# Patient Record
Sex: Male | Born: 2000 | Race: Black or African American | Hispanic: No | Marital: Single | Smoking: Never smoker
Health system: Southern US, Community
[De-identification: ages and names within clinical notes are randomized; demographics above are authoritative.]

## PROBLEM LIST (undated history)

## (undated) DIAGNOSIS — I456 Pre-excitation syndrome: Secondary | ICD-10-CM

## (undated) DIAGNOSIS — B360 Pityriasis versicolor: Secondary | ICD-10-CM

## (undated) DIAGNOSIS — R011 Cardiac murmur, unspecified: Secondary | ICD-10-CM

## (undated) DIAGNOSIS — J45909 Unspecified asthma, uncomplicated: Secondary | ICD-10-CM

## (undated) HISTORY — DX: Unspecified asthma, uncomplicated: J45.909

## (undated) HISTORY — DX: Pityriasis versicolor: B36.0

## (undated) HISTORY — PX: CIRCUMCISION: SUR203

---
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
## 2018-03-08 ENCOUNTER — Ambulatory Visit: Payer: Managed Care, Other (non HMO)

## 2018-04-05 ENCOUNTER — Ambulatory Visit (INDEPENDENT_AMBULATORY_CARE_PROVIDER_SITE_OTHER): Payer: Managed Care, Other (non HMO) | Admitting: Pediatrics

## 2018-04-05 ENCOUNTER — Encounter: Payer: Self-pay | Admitting: Pediatrics

## 2018-04-05 VITALS — Temp 98.4°F | Wt 314.6 lb

## 2018-04-05 DIAGNOSIS — G8929 Other chronic pain: Secondary | ICD-10-CM | POA: Diagnosis not present

## 2018-04-05 DIAGNOSIS — Z113 Encounter for screening for infections with a predominantly sexual mode of transmission: Secondary | ICD-10-CM

## 2018-04-05 DIAGNOSIS — M545 Low back pain: Secondary | ICD-10-CM

## 2018-04-05 NOTE — Progress Notes (Signed)
  History was provided by the patient.  No interpreter necessary.  Alexander Wright is a 17 y.o. male presents for  Chief Complaint  Patient presents with  . Back Pain    lower back pain for about 1 year- not as bad during football season but noticed it worsened after it ended last year around November- has not beem very active due to the pain.  Has been taking ibuprofen to aleviate symptoms   Takes motrin three times a week but back pain doesn't completely go away.  Takes 800mg .  Pain is worse when he is walking or running.  When he he lays flat the back pain is better.  No bowel or voiding problems.  Back pain doesn't radiate it stays in the lower area over the spine.  More sharp if he twists his body, squeezy pain normally.  Pain is keeping him from being active now.     The following portions of the patient's history were reviewed and updated as appropriate: allergies, current medications, past family history, past medical history, past social history, past surgical history and problem list.  Review of Systems  Constitutional: Negative for fever.  Gastrointestinal: Negative for abdominal pain and constipation.  Genitourinary: Negative for dysuria and urgency.  Musculoskeletal: Positive for back pain. Negative for falls, joint pain, myalgias and neck pain.     Physical Exam:  Temp 98.4 F (36.9 C) (Oral)   Wt (!) 314 lb 9.6 oz (142.7 kg)  Blood pressure percentiles are not available for patients who are 18 years or older. Wt Readings from Last 3 Encounters:  04/05/18 (!) 314 lb 9.6 oz (142.7 kg) (>99 %, Z= 3.18)*  11/30/17 288 lb (130.6 kg) (>99 %, Z= 2.92)*  10/06/17 (!) 300 lb 6.4 oz (136.3 kg) (>99 %, Z= 3.05)*   * Growth percentiles are based on CDC (Boys, 2-20 Years) data.    General:   alert, cooperative, appears stated age and no distress  Heart:   regular rate and rhythm, S1, S2 normal, no murmur, click, rub or gallop   back Tenderness over the lower lumbar spine, pain when  he bends forward and back.  Pain with hip flexion and extension.  No muscle tightness appreciated.  No back assymmetry on bend test   Neuro:  normal without focal findings     Assessment/Plan: 1. Routine screening for STI (sexually transmitted infection)  - C. trachomatis/N. gonorrhoeae RNA  2. Chronic midline low back pain without sciatica Patient has already been to a chiropractor and had a few sessions without improvement. He has been on pain medications for 1 year.  He has done conservative treatment without relief and needs better imaging of the spine to ensure there is no spondylopathy.  Waiting on PA   - MR Lumbar Spine Wo Contrast; Future - Ambulatory referral to Sports Medicine    Cherece Griffith CitronNicole Grier, MD  04/05/18

## 2018-04-06 LAB — C. TRACHOMATIS/N. GONORRHOEAE RNA
C. trachomatis RNA, TMA: NOT DETECTED
N. gonorrhoeae RNA, TMA: NOT DETECTED

## 2018-04-07 ENCOUNTER — Encounter: Payer: Self-pay | Admitting: Pediatrics

## 2018-04-07 ENCOUNTER — Telehealth: Payer: Self-pay

## 2018-04-07 ENCOUNTER — Ambulatory Visit (INDEPENDENT_AMBULATORY_CARE_PROVIDER_SITE_OTHER): Payer: Managed Care, Other (non HMO) | Admitting: Pediatrics

## 2018-04-07 ENCOUNTER — Encounter: Payer: Managed Care, Other (non HMO) | Admitting: Licensed Clinical Social Worker

## 2018-04-07 ENCOUNTER — Other Ambulatory Visit: Payer: Self-pay

## 2018-04-07 VITALS — BP 129/67 | Ht 67.0 in | Wt 313.2 lb

## 2018-04-07 DIAGNOSIS — Q210 Ventricular septal defect: Secondary | ICD-10-CM

## 2018-04-07 DIAGNOSIS — Z113 Encounter for screening for infections with a predominantly sexual mode of transmission: Secondary | ICD-10-CM

## 2018-04-07 DIAGNOSIS — Z020 Encounter for examination for admission to educational institution: Secondary | ICD-10-CM | POA: Diagnosis not present

## 2018-04-07 DIAGNOSIS — Z68.41 Body mass index (BMI) pediatric, greater than or equal to 95th percentile for age: Secondary | ICD-10-CM

## 2018-04-07 DIAGNOSIS — Z0001 Encounter for general adult medical examination with abnormal findings: Secondary | ICD-10-CM

## 2018-04-07 DIAGNOSIS — Z111 Encounter for screening for respiratory tuberculosis: Secondary | ICD-10-CM

## 2018-04-07 LAB — POCT URINALYSIS DIPSTICK
Bilirubin, UA: NEGATIVE
Blood, UA: NEGATIVE
Glucose, UA: NEGATIVE
Ketones, UA: NEGATIVE
LEUKOCYTES UA: NEGATIVE
Nitrite, UA: NEGATIVE
PH UA: 5 (ref 5.0–8.0)
PROTEIN UA: POSITIVE — AB
Spec Grav, UA: 1.025 (ref 1.010–1.025)
UROBILINOGEN UA: 0.2 U/dL

## 2018-04-07 LAB — POCT HEMOGLOBIN: Hemoglobin: 15.6 g/dL (ref 14.1–18.1)

## 2018-04-07 LAB — POCT GLYCOSYLATED HEMOGLOBIN (HGB A1C): HEMOGLOBIN A1C: 5.6 % (ref 4.0–5.6)

## 2018-04-07 LAB — POCT RAPID HIV: Rapid HIV, POC: NEGATIVE

## 2018-04-07 NOTE — Progress Notes (Signed)
Adolescent Well Care Visit Alexander Wright is a 17 y.o. male who is here for well care.    PCP:  Gwenith DailyGrier, Cherece Nicole, MD   History was provided by the patient.  Confidentiality was discussed with the patient and, if applicable, with caregiver as well. Patient's personal or confidential phone number: [phone]   Current Issues: Current concerns include   School forms Going to [employer]  VSD No symptoms Last saw cards two years ago  Nutrition: Nutrition/Eating Behaviors: feels like eats a balanced diet. Eats vegetables   Exercise/ Media: Play any Sports?/ Exercise: not still playing sports because of back pain Hoping to potentially play in college- hoping to do track. Does shotput, discus and hammer throw  Sleep:  Sleep: good  Social Screening: Lives with:  Parents- going to be on campus in college Parental relations:  good   Education: Going to [employer]   Confidential Social History: Tobacco?  no Secondhand smoke exposure?  no Drugs/ETOH?  no  Sexually Active?  yes  With women Pregnancy Prevention: condoms  Safe at home, in school & in relationships?  Yes Safe to self?  Yes   Screenings:   PHQ-9 completed and results indicated score 1, low risk, no SI  Physical Exam:  Vitals:   04/07/18 1514  BP: 129/67  Weight: (!) 313 lb 4 oz (142.1 kg)  Height: 5\' 7"  (1.702 m)   BP 129/67 (BP Location: Right Arm, Patient Position: Sitting, Cuff Size: Large)   Ht 5\' 7"  (1.702 m)   Wt (!) 313 lb 4 oz (142.1 kg)   BMI 49.06 kg/m  Body mass index: body mass index is 49.06 kg/m. Blood pressure percentiles are not available for patients who are 18 years or older.   Hearing Screening   Method: Audiometry   125Hz  250Hz  500Hz  1000Hz  2000Hz  3000Hz  4000Hz  6000Hz  8000Hz   Right ear:   20 20  20 20     Left ear:   20 20  20 20       Visual Acuity Screening   Right eye Left eye Both eyes  Without correction: 10/10 10/10 10/10   With correction:       General  Appearance:   alert, oriented, no acute distress and obese  HENT: Normocephalic, no obvious abnormality, conjunctiva clear  Mouth:   Normal appearing teeth, no obvious discoloration, dental caries, or dental caps  Neck:   Supple; thyroid: no enlargement, symmetric, no tenderness/mass/nodules  Lungs:   Clear to auscultation bilaterally, normal work of breathing  Heart:   Regular rate and rhythm, S1 and S2 normal, 2/6 systolic blowing murmurs;   Abdomen:   Soft, non-tender, no mass, or organomegaly  GU normal male genitals, no testicular masses or hernia  Musculoskeletal:   Tone and strength strong and symmetrical, all extremities               Lymphatic:   No cervical adenopathy  Skin/Hair/Nails:   Skin warm, dry and intact, no rashes, no bruises or petechiae. Acanthosis nigricans  Neurologic:   Strength, gait, and coordination normal and age-appropriate     Assessment and Plan:   1. Encounter for general adult medical examination with abnormal findings   2. BMI, pediatric > 99% for age Discussed healthy diet and exercise Not exercising due to back pain See prior visit addressing back pain Can swim without pain- will start doing that  3. Severe obesity (BMI >= 40) (HCC) A1c is 5.6- still in normal range. At risk due to family history  of diabetes and also has obesity and acanthosis. Recommend continue monitoring periodically and work on diet/exercise - POCT glycosylated hemoglobin (Hb A1C)  4. Screening examination for venereal disease - POCT Rapid HIV- negative  5. Muscular ventricular septal defect (VSD) History of small, hemodynamically insignificant VSD. Followed by [facility] peds cards- recommended calling to make an appointment because due to see them. Put in referral for adult cardiology to start transition - Ambulatory referral to Cardiology  6. Encounter for school health examination - POCT hemoglobin - POCT urinalysis dipstick - TB Skin Test  7. Screening for  tuberculosis - QuantiFERON-TB Gold Plus   BMI is not appropriate for age  Hearing screening result:normal Vision screening result: normal   Return if symptoms worsen or fail to improve..  Taffie Eckmann [place], MD

## 2018-04-07 NOTE — Patient Instructions (Signed)
  General Health / Clinics (Adults) Orange Card (for Adults) through [facility]: [phone]  [facility]:   [phone]  [facility]:   [phone]  Health Department:  [phone]  [facility]:  [phone] / [phone]  [facility]:   [phone]  [facility]:   [phone]     Websites for Teens  General www.youngwomenshealth.org www.youngmenshealthsite.org www.teenhealthfx.com www.teenhealth.org  Sexual and Reproductive Health www.bedsider.org www.seventeendays.org www.plannedparenthood.org www.StrengthHappens.sisexetc.org     Look at zerotothree.org for lots of good ideas on how to help your baby develop.  The best website for information about children is CosmeticsCritic.siwww.healthychildren.org.  All the information is reliable and up-to-date.    At every age, encourage reading.  Reading with your child is one of the best activities you can do.   Use the [employer] library near your home and borrow books every week.  The [employer] library offers amazing FREE programs for children of all ages.  Just go to www.greensborolibrary.org   Call the main number [phone] before going to the Emergency Department unless it's a true emergency.  For a true emergency, go to the [facility].   When the clinic is closed, a nurse always answers the main number [phone] and a doctor is always available.    Clinic is open for sick visits only on Saturday mornings from 8:30AM to 12:30PM. Call first thing on Saturday morning for an appointment.

## 2018-04-07 NOTE — Telephone Encounter (Signed)
PA initiated for MRI of low back. [id number] service number.

## 2018-04-08 NOTE — Telephone Encounter (Signed)
PA pending. [facility] as [facility] was not in network.

## 2018-04-09 ENCOUNTER — Other Ambulatory Visit: Payer: Self-pay | Admitting: Pediatrics

## 2018-04-09 LAB — QUANTIFERON-TB GOLD PLUS
NIL: 0.02 [IU]/mL
QUANTIFERON-TB GOLD PLUS: NEGATIVE
TB1-NIL: 0.01 IU/mL
TB2-NIL: 0 [IU]/mL

## 2018-04-09 NOTE — Telephone Encounter (Signed)
Case has been approved PA# [id number]. Hard copy taken to referral office.

## 2018-04-09 NOTE — Addendum Note (Signed)
Addended by: Warden FillersGRIER, Abdalla Naramore on: 04/09/2018 08:51 AM   Modules accepted: Orders

## 2018-04-24 ENCOUNTER — Ambulatory Visit
Admission: RE | Admit: 2018-04-24 | Discharge: 2018-04-24 | Disposition: A | Discharge status: Home / Self Care | Payer: Self-pay | Source: Ambulatory Visit | Attending: Pediatrics | Admitting: Pediatrics

## 2018-04-24 DIAGNOSIS — M545 Low back pain, unspecified: Secondary | ICD-10-CM

## 2018-04-24 DIAGNOSIS — G8929 Other chronic pain: Secondary | ICD-10-CM

## 2018-04-26 NOTE — Progress Notes (Signed)
Notified of results and plan of care.

## 2018-04-26 NOTE — Addendum Note (Signed)
Addended by: Warden FillersGRIER, CHERECE on: 04/26/2018 02:32 PM   Modules accepted: Orders

## 2018-05-11 ENCOUNTER — Ambulatory Visit: Payer: Managed Care, Other (non HMO) | Attending: Pediatrics | Admitting: Physical Therapy

## 2018-05-13 ENCOUNTER — Ambulatory Visit: Payer: Self-pay | Admitting: Cardiovascular Disease

## 2018-05-21 ENCOUNTER — Encounter: Payer: Self-pay | Admitting: Licensed Clinical Social Worker

## 2018-05-21 ENCOUNTER — Ambulatory Visit: Payer: Managed Care, Other (non HMO) | Admitting: Pediatrics

## 2018-11-09 ENCOUNTER — Encounter: Payer: Self-pay | Admitting: Pediatrics

## 2018-11-09 ENCOUNTER — Other Ambulatory Visit: Payer: Self-pay

## 2018-11-09 ENCOUNTER — Ambulatory Visit (INDEPENDENT_AMBULATORY_CARE_PROVIDER_SITE_OTHER): Payer: Managed Care, Other (non HMO) | Admitting: Pediatrics

## 2018-11-09 VITALS — Temp 98.1°F | Wt 326.2 lb

## 2018-11-09 DIAGNOSIS — J029 Acute pharyngitis, unspecified: Secondary | ICD-10-CM

## 2018-11-09 DIAGNOSIS — B349 Viral infection, unspecified: Secondary | ICD-10-CM

## 2018-11-09 LAB — POCT RAPID STREP A (OFFICE): RAPID STREP A SCREEN: NEGATIVE

## 2018-11-09 NOTE — Progress Notes (Signed)
Subjective:    Alexander Wright is a 18 y.o. old male here with his self for Cough (since Friday night ); sneezing; and Sore Throat (since Friday night ) .    HPI Chief Complaint  Patient presents with  . Cough    since Friday night   . sneezing  . Sore Throat    since Friday night    17yo here for URI sx.  It started w/ ST 4d. Ago, that has been worsening.  He now has chest congestion, RN and cough.  Robitussin has not helped. He c/o chest pain w/ cough and c/o HA when he sneezes.   Review of Systems  Constitutional: Negative for appetite change and fever.  HENT: Positive for congestion, sneezing and sore throat.   Respiratory: Positive for cough.     History and Problem List: Alexander Wright has Muscular ventricular septal defect (VSD); Asthma, mild intermittent; Concussion without loss of consciousness; BMI, pediatric > 99% for age; Elevated BP without diagnosis of hypertension; and Severe obesity (BMI >= 40) (HCC) on their problem list.  Alexander Wright  has a past medical history of Asthma, Heart murmur, Tinea versicolor, and WPW (Wolff-Parkinson-White syndrome).  Immunizations needed: none     Objective:    Temp 98.1 F (36.7 C) (Oral)   Wt (!) 326 lb 3.2 oz (148 kg)   BMI 51.09 kg/m  Physical Exam Constitutional:      Appearance: He is well-developed.  HENT:     Right Ear: Tympanic membrane and external ear normal.     Left Ear: Tympanic membrane and external ear normal.     Nose: Congestion present.     Mouth/Throat:     Pharynx: Posterior oropharyngeal erythema (mild) present.     Comments: Hypertrophied tonsils Eyes:     Pupils: Pupils are equal, round, and reactive to light.  Neck:     Musculoskeletal: Normal range of motion.  Cardiovascular:     Rate and Rhythm: Normal rate and regular rhythm.     Pulses: Normal pulses.     Heart sounds: Normal heart sounds.  Pulmonary:     Effort: Pulmonary effort is normal.     Breath sounds: Normal breath sounds.  Abdominal:     General:  Bowel sounds are normal.     Palpations: Abdomen is soft.  Skin:    General: Skin is warm.     Capillary Refill: Capillary refill takes less than 2 seconds.  Neurological:     Mental Status: He is alert and oriented to person, place, and time.        Assessment and Plan:   Alexander Wright is a 18 y.o. old male with  1. Sore throat  - POCT rapid strep A  2. Viral illness -supportive care -use albuterol as needed for cough or chest tightness.  Continue claritin and flonase for congestion    No follow-ups on file.  Marjory Sneddon, MD

## 2018-11-09 NOTE — Patient Instructions (Signed)
Viral Illness, Adult °Viruses are tiny germs that can get into a person's body and cause illness. There are many different types of viruses, and they cause many types of illness. Viral illnesses can range from mild to severe. They can affect various parts of the body. °Common illnesses that are caused by a virus include colds and the flu. Viral illnesses also include serious conditions such as HIV/AIDS (human immunodeficiency virus/acquired immunodeficiency syndrome). A few viruses have been linked to certain cancers. °What are the causes? °Many types of viruses can cause illness. Viruses invade cells in your body, multiply, and cause the infected cells to malfunction or die. When the cell dies, it releases more of the virus. When this happens, you develop symptoms of the illness, and the virus continues to spread to other cells. If the virus takes over the function of the cell, it can cause the cell to divide and grow out of control, as is the case when a virus causes cancer. °Different viruses get into the body in different ways. You can get a virus by: °· Swallowing food or water that is contaminated with the virus. °· Breathing in droplets that have been coughed or sneezed into the air by an infected person. °· Touching a surface that has been contaminated with the virus and then touching your eyes, nose, or mouth. °· Being bitten by an insect or animal that carries the virus. °· Having sexual contact with a person who is infected with the virus. °· Being exposed to blood or fluids that contain the virus, either through an open cut or during a transfusion. °If a virus enters your body, your body's defense system (immune system) will try to fight the virus. You may be at higher risk for a viral illness if your immune system is weak. °What are the signs or symptoms? °Symptoms vary depending on the type of virus and the location of the cells that it invades. Common symptoms of the main types of viral illnesses  include: °Cold and flu viruses °· Fever. °· Headache. °· Sore throat. °· Muscle aches. °· Nasal congestion. °· Cough. °Digestive system (gastrointestinal) viruses °· Fever. °· Abdominal pain. °· Nausea. °· Diarrhea. °Liver viruses (hepatitis) °· Loss of appetite. °· Tiredness. °· Yellowing of the skin (jaundice). °Brain and spinal cord viruses °· Fever. °· Headache. °· Stiff neck. °· Nausea and vomiting. °· Confusion or sleepiness. °Skin viruses °· Warts. °· Itching. °· Rash. °Sexually transmitted viruses °· Discharge. °· Swelling. °· Redness. °· Rash. °How is this treated? °Viruses can be difficult to treat because they live within cells. Antibiotic medicines do not treat viruses because these drugs do not get inside cells. Treatment for a viral illness may include: °· Resting and drinking plenty of fluids. °· Medicines to relieve symptoms. These can include over-the-counter medicine for pain and fever, medicines for cough or congestion, and medicines to relieve diarrhea. °· Antiviral medicines. These drugs are available only for certain types of viruses. They may help reduce flu symptoms if taken early. There are also many antiviral medicines for hepatitis and HIV/AIDS. °Some viral illnesses can be prevented with vaccinations. A common example is the flu shot. °Follow these instructions at home: °Medicines ° °· Take over-the-counter and prescription medicines only as told by your health care provider. °· If you were prescribed an antiviral medicine, take it as told by your health care provider. Do not stop taking the medicine even if you start to feel better. °· Be aware of when   antibiotics are needed and when they are not needed. Antibiotics do not treat viruses. If your health care provider thinks that you may have a bacterial infection as well as a viral infection, you may get an antibiotic. °? Do not ask for an antibiotic prescription if you have been diagnosed with a viral illness. That will not make your  illness go away faster. °? Frequently taking antibiotics when they are not needed can lead to antibiotic resistance. When this develops, the medicine no longer works against the bacteria that it normally fights. °General instructions °· Drink enough fluids to keep your urine clear or pale yellow. °· Rest as much as possible. °· Return to your normal activities as told by your health care provider. Ask your health care provider what activities are safe for you. °· Keep all follow-up visits as told by your health care provider. This is important. °How is this prevented? °Take these actions to reduce your risk of viral infection: °· Eat a healthy diet and get enough rest. °· Wash your hands often with soap and water. This is especially important when you are in public places. If soap and water are not available, use hand sanitizer. °· Avoid close contact with friends and family who have a viral illness. °· If you travel to areas where viral gastrointestinal infection is common, avoid drinking water or eating raw food. °· Keep your immunizations up to date. Get a flu shot every year as told by your health care provider. °· Do not share toothbrushes, nail clippers, razors, or needles with other people. °· Always practice safe sex. ° °Contact a health care provider if: °· You have symptoms of a viral illness that do not go away. °· Your symptoms come back after going away. °· Your symptoms get worse. °Get help right away if: °· You have trouble breathing. °· You have a severe headache or a stiff neck. °· You have severe vomiting or abdominal pain. °This information is not intended to replace advice given to you by your health care provider. Make sure you discuss any questions you have with your health care provider. °Document Released: 12/28/2015 Document Revised: 01/30/2016 Document Reviewed: 12/28/2015 °Elsevier Interactive Patient Education © 2019 Elsevier Inc. ° °

## 2019-05-17 ENCOUNTER — Other Ambulatory Visit: Payer: Self-pay

## 2019-05-17 DIAGNOSIS — Z20822 Contact with and (suspected) exposure to covid-19: Secondary | ICD-10-CM

## 2019-05-19 LAB — NOVEL CORONAVIRUS, NAA: SARS-CoV-2, NAA: DETECTED — AB

## 2019-05-19 NOTE — Progress Notes (Signed)
Patient's symptoms are loss of sense of smell and decreased sense of taste. Also is having some chest tightness but is not short of breath.  Explained to patient that he can use albuterol for chest tightness. Discussed quarantine and reasons he would need to seek care. Understanding verbalized.

## 2019-07-22 ENCOUNTER — Other Ambulatory Visit: Payer: Self-pay

## 2019-07-22 ENCOUNTER — Ambulatory Visit (INDEPENDENT_AMBULATORY_CARE_PROVIDER_SITE_OTHER): Payer: Managed Care, Other (non HMO) | Admitting: Student

## 2019-07-22 ENCOUNTER — Encounter: Payer: Self-pay | Admitting: Student

## 2019-07-22 DIAGNOSIS — J301 Allergic rhinitis due to pollen: Secondary | ICD-10-CM

## 2019-07-22 DIAGNOSIS — J069 Acute upper respiratory infection, unspecified: Secondary | ICD-10-CM

## 2019-07-22 DIAGNOSIS — J4520 Mild intermittent asthma, uncomplicated: Secondary | ICD-10-CM

## 2019-07-22 DIAGNOSIS — Z20822 Contact with and (suspected) exposure to covid-19: Secondary | ICD-10-CM

## 2019-07-22 MED ORDER — AEROCHAMBER PLUS FLO-VU LARGE MISC: 1.0000 | Freq: Once | 0 refills | Status: AC

## 2019-07-22 MED ORDER — ALBUTEROL SULFATE HFA 108 (90 BASE) MCG/ACT IN AERS: 2.0000 | INHALATION_SPRAY | RESPIRATORY_TRACT | 2 refills | Status: AC | PRN

## 2019-07-22 MED ORDER — FLUTICASONE PROPIONATE 50 MCG/ACT NA SUSP: 1.0000 | Freq: Two times a day (BID) | NASAL | 12 refills | Status: DC

## 2019-07-22 NOTE — Progress Notes (Signed)
Virtual Visit via Video Note  I connected with Alexander Wright on 07/22/19 at 11:10 AM EST by a video enabled telemedicine application and verified that I am speaking with the correct person using two identifiers.   Location of patient: Home   I discussed the limitations of evaluation and management by telemedicine and the availability of in person appointments.  I discussed that the purpose of this telehealth visit is to provide medical care while limiting exposure to the novel coronavirus.  The patient expressed understanding and agreed to proceed.  Reason for visit:  Rhinorrhea, sore throat  History of Present Illness:  Increased sweating for approximately 3 days Rhinorrhea started yesterday, no nasal congestion; sneezing, cough Throat felt dry and sore No fever, vomiting, diarrhea No difficulty breathing   Observations/Objective:  Well-appearing Speaking full sentences, comfortable work of breathing  Assessment and Plan:  Alexander Wright is a 18 year old male with history of allergic rhinitis and mild intermittent asthma that was seen via video visit for sore throat and rhinorrhea over last several days. No fever, difficulty breathing, vomiting, diarrhea, or rash. Covid tested today 11/20. Symptoms may be consistent with viral URI (Covid cannot be excluded) versus allergic rhinitis. Low concern for asthma exacerbation as no wheezing, shortness of breath, or difficulty breathing.   1. Viral upper respiratory infection Discussed supportive care, return precautions Covid test pending, discussed CDC isolation guidelines Provided work note via Pharmacist, community   2. Allergic rhinitis due to pollen, unspecified seasonality Restart Flonase Continue Claritin  - fluticasone (FLONASE) 50 MCG/ACT nasal spray; Place 1 spray into both nostrils 2 (two) times daily.  Dispense: 16 g; Refill: 12  3. Mild intermittent asthma without complication Refilled albuterol, ordered spacer Discussed use and reasons to call  clinic/be seen in ED - albuterol (VENTOLIN HFA) 108 (90 Base) MCG/ACT inhaler; Inhale 2 puffs into the lungs every 4 (four) hours as needed for wheezing or shortness of breath (or cough).  Dispense: 18 g; Refill: 2 - Spacer/Aero-Holding Chambers (AEROCHAMBER PLUS FLO-VU LARGE) MISC; 1 each by Other route once for 1 dose.  Dispense: 1 each; Refill: 0   Follow Up Instructions: PRN if symptoms worsen   I discussed the assessment and treatment plan with the patient and/or parent/guardian. They were provided an opportunity to ask questions and all were answered. They agreed with the plan and demonstrated an understanding of the instructions.   They were advised to call back or seek an in-person evaluation in the emergency room if the symptoms worsen or if the condition fails to improve as anticipated.  I spent 15 minutes on this telehealth visit inclusive of face-to-face video and care coordination time I was located at the office during this encounter.  Dorna Leitz, MD

## 2019-07-24 LAB — NOVEL CORONAVIRUS, NAA: SARS-CoV-2, NAA: NOT DETECTED

## 2019-07-25 ENCOUNTER — Telehealth: Payer: Self-pay

## 2019-07-25 NOTE — Telephone Encounter (Signed)
Alexander Wright had an appointment 07/22/2019 for URI and allergy symptoms. He started feeling better today. Reports sore throat resolved and he has no fever. Continues with nasal and chest congestion. Patient is to return to work today. Discussed with Dr. Michel Santee and plan is for him to stay home a few more days to continue recovery. Will write return to note work for him to return 07/28/2019.

## 2019-08-03 ENCOUNTER — Other Ambulatory Visit: Payer: Self-pay

## 2019-08-03 DIAGNOSIS — Z20822 Contact with and (suspected) exposure to covid-19: Secondary | ICD-10-CM

## 2019-08-05 LAB — NOVEL CORONAVIRUS, NAA: SARS-CoV-2, NAA: NOT DETECTED

## 2019-12-10 ENCOUNTER — Ambulatory Visit: Payer: Self-pay | Attending: Internal Medicine

## 2019-12-10 DIAGNOSIS — Z23 Encounter for immunization: Secondary | ICD-10-CM

## 2019-12-10 NOTE — Progress Notes (Signed)
   Covid-19 Vaccination Clinic  Name:  Alexander Wright    MRN: [id number] DOB: 01-Mar-2001  12/10/2019  Alexander Wright was observed post Covid-19 immunization for 15 minutes without incident. He was provided with Vaccine Information Sheet and instruction to access the V-Safe system.   Alexander Wright was instructed to call 911 with any severe reactions post vaccine: Marland Kitchen Difficulty breathing  . Swelling of face and throat  . A fast heartbeat  . A bad rash all over body  . Dizziness and weakness   Immunizations Administered    Name Date Dose VIS Date Route   Pfizer COVID-19 Vaccine 12/10/2019  3:20 PM 0.3 mL 08/12/2019 Intramuscular   Manufacturer: [employer], [employer]   Lot: [id number]   NDC: [address]-0

## 2019-12-23 ENCOUNTER — Telehealth (INDEPENDENT_AMBULATORY_CARE_PROVIDER_SITE_OTHER): Payer: Managed Care, Other (non HMO) | Admitting: Student in an Organized Health Care Education/Training Program

## 2019-12-23 ENCOUNTER — Other Ambulatory Visit: Payer: Self-pay

## 2019-12-23 ENCOUNTER — Encounter: Payer: Self-pay | Admitting: Student in an Organized Health Care Education/Training Program

## 2019-12-23 DIAGNOSIS — B86 Scabies: Secondary | ICD-10-CM | POA: Diagnosis not present

## 2019-12-23 MED ORDER — PERMETHRIN 5 % EX CREA: 1.0000 "application " | TOPICAL_CREAM | Freq: Once | CUTANEOUS | 0 refills | Status: AC

## 2019-12-23 NOTE — Progress Notes (Signed)
Virtual Visit via Video Note  I connected with Alexander Wright 's patient  on 12/23/19 at  2:05 PM EDT by a video enabled telemedicine application and verified that I am speaking with the correct person using two identifiers.   Location of patient/parent: home   I discussed the limitations of evaluation and management by telemedicine and the availability of in person appointments.  I discussed that the purpose of this telehealth visit is to provide medical care while limiting exposure to the novel coronavirus.    I advised the patient  that by engaging in this telehealth visit, they consent to the provision of healthcare.  Additionally, they authorize for the patient's insurance to be billed for the services provided during this telehealth visit.  They expressed understanding and agreed to proceed.  Reason for visit:  Rash on the back of hands  History of Present Illness:  Alexander Wright is a 19 yo male who presents via video visit for concern of rash on back of bilateral hands. He reports 4 days ago he developed small bumps that were itchy and have recently spread down his wrists. He reports using OTC Cortizone cream for the itchiness, but his main concern is that it has spread down to his wrists. He does report that he has them along his fingers and inter phalange webs. He reports no other contact with people with similar rash. Rest of ROS is negative. No contact with poison ivy.   Observations/Objective:  -small erythematous papules on back of hands and wrists -no linear streaking or vesicular rash noted  Assessment and Plan:  Alexander Wright is a 19 yo male presenting with bilateral rash on the dorsum on his hands that is pruritic and spreading down to his wrists and inter phalange webs. He is otherwise well. The progression of his rash and location of rash is consistent with scabies. Plan to treat with permethrin. Instruction was given on how to treat scabies and patient voiced understanding. I advised he could  continue the Cortizone cream for the pruritic areas of his hands. Less likely contact dermatitis given history and physical.   Follow Up Instructions: W   I discussed the assessment and treatment plan with the patient and/or parent/guardian. They were provided an opportunity to ask questions and all were answered. They agreed with the plan and demonstrated an understanding of the instructions.   They were advised to call back or seek an in-person evaluation in the emergency room if the symptoms worsen or if the condition fails to improve as anticipated.  Time spent reviewing chart in preparation for visit:  5 minutes Time spent face-to-face with patient: 15 minutes Time spent not face-to-face with patient for documentation and care coordination on date of service: 5 minutes  I was located at [facility] during this encounter.  Dorena Bodo, MD

## 2019-12-26 ENCOUNTER — Telehealth: Payer: Self-pay | Admitting: Pediatrics

## 2019-12-26 NOTE — Telephone Encounter (Signed)
Pre-screening for onsite visit  1. Who is bringing the patient to the visit? Self  Informed only one adult can bring patient to the visit to limit possible exposure to COVID19 and facemasks must be worn while in the building by the patient (ages 2 and older) and adult.  2. Has the person bringing the patient or the patient been around anyone with suspected or confirmed COVID-19 in the last 14 days? No   3. Has the person bringing the patient or the patient been around anyone who has been tested for COVID-19 in the last 14 days? No  4. Has the person bringing the patient or the patient had any of these symptoms in the last 14 days? No   Fever (temp 100 F or higher) Breathing problems Cough Sore throat Body aches Chills Vomiting Diarrhea Loss of taste or smell   If all answers are negative, advise patient to call our office prior to your appointment if you or the patient develop any of the symptoms listed above.   If any answers are yes, cancel in-office visit and schedule the patient for a same day telehealth visit with a provider to discuss the next steps.  

## 2019-12-27 ENCOUNTER — Other Ambulatory Visit: Payer: Self-pay

## 2019-12-27 ENCOUNTER — Telehealth (INDEPENDENT_AMBULATORY_CARE_PROVIDER_SITE_OTHER): Payer: Managed Care, Other (non HMO) | Admitting: Pediatrics

## 2019-12-27 ENCOUNTER — Encounter: Payer: Self-pay | Admitting: Pediatrics

## 2019-12-27 DIAGNOSIS — Z09 Encounter for follow-up examination after completed treatment for conditions other than malignant neoplasm: Secondary | ICD-10-CM

## 2019-12-27 DIAGNOSIS — B86 Scabies: Secondary | ICD-10-CM

## 2019-12-27 NOTE — Progress Notes (Signed)
Virtual Visit via Video Note  I connected with Ladislav Caselli 's patient  on 12/27/19 at  4:20 PM EDT by a video enabled telemedicine application and verified that I am speaking with the correct person using two identifiers.   Location of patient/parent:  [place], [place]    I discussed the limitations of evaluation and management by telemedicine and the availability of in person appointments.  I discussed that the purpose of this telehealth visit is to provide medical care while limiting exposure to the novel coronavirus.    I advised the patient  that by engaging in this telehealth visit, they consent to the provision of healthcare.  Additionally, they authorize for the patient's insurance to be billed for the services provided during this telehealth visit.  They expressed understanding and agreed to proceed.  Reason for visit:  Follow up   History of Present Illness:   At the last visit on 4/23, he was diagnosed with scabies for itchy bumps on his arm.  He took the permethrin all over the body, left it on 10 hours. The bumps are going down and starting to get dark spots, the bumps were feeling dry. The skin was scaly.   After he took it, some bumps went away.  The dark spots are increasing and scaling. Some spots have started peeling. Keeping moistuized and its a bit iitchy.  The spots were on the back of his hand.  He treated himself on Saturday morning, Sunday morning.     Observations/Objective:  Unable to do skin exam as patient riding in car.   Assessment and Plan:   19 yr old recently treated for scabies. Currently doing well with improvement in rash.   Contact office as needed for new changes in skin.  Expect that there might be residual itching and if that happens we can prescribe anti-itch meds for alleviation.   Follow Up Instructions:  As above.    I discussed the assessment and treatment plan with the patient and/or parent/guardian. They were provided an opportunity to ask  questions and all were answered. They agreed with the plan and demonstrated an understanding of the instructions.   They were advised to call back or seek an in-person evaluation in the emergency room if the symptoms worsen or if the condition fails to improve as anticipated.  Time spent reviewing chart in preparation for visit:  1 minutes Time spent face-to-face with patient: 5 minutes Time spent not face-to-face with patient for documentation and care coordination on date of service: 3 minutes  I was located at [facility] during this encounter.  Darrall Dears, MD

## 2020-01-02 ENCOUNTER — Ambulatory Visit: Payer: Self-pay | Attending: Internal Medicine

## 2020-01-02 DIAGNOSIS — Z23 Encounter for immunization: Secondary | ICD-10-CM

## 2020-01-02 NOTE — Progress Notes (Signed)
   Covid-19 Vaccination Clinic  Name:  Alexander Wright    MRN: [id number] DOB: 2000-12-26  01/02/2020  [name] was observed post Covid-19 immunization for 15 minutes without incident. He was provided with Vaccine Information Sheet and instruction to access the V-Safe system.   [name] was instructed to call 911 with any severe reactions post vaccine: Marland Kitchen Difficulty breathing  . Swelling of face and throat  . A fast heartbeat  . A bad rash all over body  . Dizziness and weakness   Immunizations Administered    Name Date Dose VIS Date Route   Pfizer COVID-19 Vaccine 01/02/2020  1:29 PM 0.3 mL 10/26/2018 Intramuscular   Manufacturer: [employer], [employer]   Lot: [id number]   NDC: [address]-5

## 2020-01-13 ENCOUNTER — Telehealth: Payer: Self-pay | Admitting: Pediatrics

## 2020-01-13 NOTE — Telephone Encounter (Signed)
Pre-screening for onsite visit  1. Who is bringing the patient to the visit? Self  Informed only one adult can bring patient to the visit to limit possible exposure to COVID19 and facemasks must be worn while in the building by the patient (ages 2 and older) and adult.  2. Has the person bringing the patient or the patient been around anyone with suspected or confirmed COVID-19 in the last 14 days? No  3. Has the person bringing the patient or the patient been around anyone who has been tested for COVID-19 in the last 14 days? No  4. Has the person bringing the patient or the patient had any of these symptoms in the last 14 days? NO  Fever (temp 100 F or higher) Breathing problems Cough Sore throat Body aches Chills Vomiting Diarrhea Loss of taste or smell   If all answers are negative, advise patient to call our office prior to your appointment if you or the patient develop any of the symptoms listed above.   If any answers are yes, cancel in-office visit and schedule the patient for a same day telehealth visit with a provider to discuss the next steps.  

## 2020-01-16 ENCOUNTER — Ambulatory Visit: Payer: Managed Care, Other (non HMO) | Admitting: Pediatrics

## 2020-05-21 ENCOUNTER — Ambulatory Visit: Payer: Self-pay | Admitting: Family Medicine

## 2020-06-16 ENCOUNTER — Other Ambulatory Visit: Payer: Self-pay

## 2020-06-16 ENCOUNTER — Ambulatory Visit (HOSPITAL_COMMUNITY)
Admission: EM | Admit: 2020-06-16 | Discharge: 2020-06-16 | Disposition: A | Discharge status: Home / Self Care | Payer: 59

## 2020-06-16 ENCOUNTER — Encounter (HOSPITAL_COMMUNITY): Payer: Self-pay | Admitting: *Deleted

## 2020-06-16 DIAGNOSIS — R030 Elevated blood-pressure reading, without diagnosis of hypertension: Secondary | ICD-10-CM | POA: Diagnosis not present

## 2020-06-16 NOTE — ED Triage Notes (Signed)
Pt reports checking his BP at home  Today home results 167/115,178/11, 138/99. Pt does not have a HA. Pt reports blurred vision.

## 2020-06-16 NOTE — ED Provider Notes (Signed)
[facility]    CSN: [id number] Arrival date & time: 06/16/20  1645      History   Chief Complaint Chief Complaint  Patient presents with   Hypertension    HPI Robbi Farrel is a 19 y.o. male.   Deverick Pisarski presents with concerns about his blood pressure. He has been noticing episodes of higher heart rate. Occasional dizziness with quicker head movements. Periodic blurred vision which will last just for seconds and resolves with focusing and blinking. weight gain over the past year due to inactivity and increased intake.  Has been checking his BP with a wrist monitor- 180's, 140's, and 150's. He has been noticing these things over the past 5 months. Does not have a PCP. Denies any previous hypertension. Has been diagnosed with WPW, hasn't followed with cardiology in years. No current symptoms or acute complaints.     ROS per HPI, negative if not otherwise mentioned.      Past Medical History:  Diagnosis Date   Asthma    Heart murmur    Tinea versicolor    WPW (Wolff-Parkinson-White syndrome)     Patient Active Problem List   Diagnosis Date Noted   Severe obesity (BMI >= 40) (HCC) 04/07/2018   BMI, pediatric > 99% for age 30/04/2017   Elevated BP without diagnosis of hypertension 11/06/2016   Concussion without loss of consciousness 03/16/2014   Asthma, mild intermittent 02/22/2014   Muscular ventricular septal defect (VSD) 11/15/2013    Past Surgical History:  Procedure Laterality Date   CIRCUMCISION         Home Medications    Prior to Admission medications   Medication Sig Start Date End Date Taking? Authorizing Provider  ibuprofen (ADVIL,MOTRIN) 200 MG tablet Take by mouth.   Yes [provider]  ibuprofen (ADVIL,MOTRIN) 800 MG tablet Take 1 tablet (800 mg total) by mouth every 12 (twelve) hours as needed for mild pain. 04/03/17  Yes Pritt, Jodelle Gross, MD  Magnesium Oxide 500 MG TABS Take by mouth.   Yes [provider]  albuterol (VENTOLIN HFA) 108 (90 Base) MCG/ACT inhaler Inhale 2 puffs into the lungs every 4 (four) hours as needed for wheezing or shortness of breath (or cough). 07/22/19   Alexander Mt, MD  fluticasone (FLONASE) 50 MCG/ACT nasal spray Place 1 spray into both nostrils 2 (two) times daily. 07/22/19   Alexander Mt, MD  loratadine (CLARITIN) 10 MG tablet Take 1 tablet (10 mg total) by mouth daily. 04/03/17 07/22/19  Pritt, Jodelle Gross, MD  methocarbamol (ROBAXIN) 500 MG tablet Take 500 mg by mouth every 6 (six) hours as needed for muscle spasms.    [provider]    Family History Family History  Problem Relation Age of Onset   Diabetes Maternal Grandmother     Social History Social History   Tobacco Use   Smoking status: Never Smoker   Smokeless tobacco: Never Used  Substance Use Topics   Alcohol use: No   Drug use: No     Allergies   Patient has no known allergies.   Review of Systems Review of Systems   Physical Exam Triage Vital Signs ED Triage Vitals  Enc Vitals Group     BP 06/16/20 1711 (!) 156/98     Pulse Rate 06/16/20 1711 97     Resp 06/16/20 1711 18     Temp 06/16/20 1711 98.8 F (37.1 C)     Temp Source 06/16/20 1711 Oral  SpO2 06/16/20 1711 100 %     Weight 06/16/20 1713 (!) 350 lb (158.8 kg)     Height 06/16/20 1713 5' 7.5" (1.715 m)     Head Circumference --      Peak Flow --      Pain Score 06/16/20 1713 0     Pain Loc --      Pain Edu? --      Excl. in GC? --    No data found.  Updated Vital Signs BP (!) 156/98 (BP Location: Right Arm)    Pulse 97    Temp 98.8 F (37.1 C) (Oral)    Resp 18    Ht 5' 7.5" (1.715 m)    Wt (!) 350 lb (158.8 kg)    SpO2 100%    BMI 54.01 kg/m   Visual Acuity Right Eye Distance:   Left Eye Distance:   Bilateral Distance:    Right Eye Near:   Left Eye Near:    Bilateral Near:     Physical Exam Constitutional:      Appearance: He is well-developed. He is obese.   Cardiovascular:     Rate and Rhythm: Normal rate and regular rhythm.     Heart sounds: Normal heart sounds.  Pulmonary:     Effort: Pulmonary effort is normal.     Breath sounds: Normal breath sounds.  Skin:    General: Skin is warm and dry.  Neurological:     Mental Status: He is alert and oriented to person, place, and time.      UC Treatments / Results  Labs (all labs ordered are listed, but only abnormal results are displayed) Labs Reviewed - No data to display  EKG   Radiology No results found.  Procedures Procedures (including critical care time)  Medications Ordered in UC Medications - No data to display  Initial Impression / Assessment and Plan / UC Course  I have reviewed the triage vital signs and the nursing notes.  Pertinent labs & imaging results that were available during my care of the patient were reviewed by me and considered in my medical decision making (see chart for details).     No acute complaints. Noted elevated BP here today. Per chart review has had some elevated readings in the past. No red flag findings on exam. 19 year old male with weight gain due to lifestyle choices over the past year. Discussed at length lifestyle modifications with plan for follow up with PCP for recheck of BP. Return precautions provided. Patient verbalized understanding and agreeable to plan.   Final Clinical Impressions(s) / UC Diagnoses   Final diagnoses:  Elevated blood pressure reading     Discharge Instructions     I do think that weight management and lifestyle adjustments can help to decrease your blood pressure.  I would recommend starting to track your food intake with My Fitness Pal, as a visual way to monitor calories. You do not always have be on super strict diets to manage weight. Limit sugar and high calorie beverages.  Increase your physical activity.  Please follow up with a primary care provider for recheck as you may need medication if no  improvement of your BP.  Return for any chest pain, swelling, severe headache or constant vision changes.     ED Prescriptions    None     PDMP not reviewed this encounter.   Georgetta Haber, NP 06/16/20 2136

## 2020-06-16 NOTE — Discharge Instructions (Signed)
I do think that weight management and lifestyle adjustments can help to decrease your blood pressure.  I would recommend starting to track your food intake with My Fitness Pal, as a visual way to monitor calories. You do not always have be on super strict diets to manage weight. Limit sugar and high calorie beverages.  Increase your physical activity.  Please follow up with a primary care provider for recheck as you may need medication if no improvement of your BP.  Return for any chest pain, swelling, severe headache or constant vision changes.

## 2020-10-12 ENCOUNTER — Emergency Department
Admission: EM | Admit: 2020-10-12 | Discharge: 2020-10-12 | Disposition: A | Discharge status: Home / Self Care | Payer: 59 | Attending: Emergency Medicine | Admitting: Emergency Medicine

## 2020-10-12 ENCOUNTER — Emergency Department: Payer: 59

## 2020-10-12 ENCOUNTER — Other Ambulatory Visit: Payer: Self-pay

## 2020-10-12 ENCOUNTER — Other Ambulatory Visit: Payer: Self-pay | Admitting: Internal Medicine

## 2020-10-12 DIAGNOSIS — R202 Paresthesia of skin: Secondary | ICD-10-CM | POA: Diagnosis not present

## 2020-10-12 DIAGNOSIS — N644 Mastodynia: Secondary | ICD-10-CM | POA: Diagnosis not present

## 2020-10-12 DIAGNOSIS — Z6841 Body Mass Index (BMI) 40.0 and over, adult: Secondary | ICD-10-CM

## 2020-10-12 DIAGNOSIS — J4520 Mild intermittent asthma, uncomplicated: Secondary | ICD-10-CM | POA: Diagnosis not present

## 2020-10-12 DIAGNOSIS — Z7951 Long term (current) use of inhaled steroids: Secondary | ICD-10-CM | POA: Insufficient documentation

## 2020-10-12 LAB — CBC
HCT: 44.3 % (ref 39.0–52.0)
Hemoglobin: 14.7 g/dL (ref 13.0–17.0)
MCH: 26.4 pg (ref 26.0–34.0)
MCHC: 33.2 g/dL (ref 30.0–36.0)
MCV: 79.5 fL — ABNORMAL LOW (ref 80.0–100.0)
Platelets: 323 10*3/uL (ref 150–400)
RBC: 5.57 MIL/uL (ref 4.22–5.81)
RDW: 14 % (ref 11.5–15.5)
WBC: 7.5 10*3/uL (ref 4.0–10.5)
nRBC: 0 % (ref 0.0–0.2)

## 2020-10-12 LAB — BASIC METABOLIC PANEL
Anion gap: 12 (ref 5–15)
BUN: 10 mg/dL (ref 6–20)
CO2: 24 mmol/L (ref 22–32)
Calcium: 9.7 mg/dL (ref 8.9–10.3)
Chloride: 102 mmol/L (ref 98–111)
Creatinine, Ser: 1.05 mg/dL (ref 0.61–1.24)
GFR, Estimated: >60 mL/min (ref >60)
Glucose, Bld: 93 mg/dL (ref 70–99)
Potassium: 4.2 mmol/L (ref 3.5–5.1)
Sodium: 138 mmol/L (ref 135–145)

## 2020-10-12 LAB — TROPONIN I (HIGH SENSITIVITY): Troponin I (High Sensitivity): 2 ng/L (ref <18)

## 2020-10-12 NOTE — ED Triage Notes (Signed)
Pt to ED POV for chest discomfort/tightness that started intermittently a few days ago radiating to left arm. Pt states feeling is not present at this time.  RR even and unlabored at this time. NAD noted. Alert and oriented, clear speech  +shob Denies N/V

## 2020-10-12 NOTE — ED Provider Notes (Signed)
[facility] Emergency Department Provider Note   ____________________________________________   Event Date/Time   First MD Initiated Contact with Patient 10/12/20 1940     (approximate)  I have reviewed the triage vital signs and the nursing notes.   HISTORY  Chief Complaint chest discomfort    HPI Alexander Wright is a 20 y.o. male with a stated past medical history of Wolff-Parkinson-White, asthma, and prediabetes who presents for 1 month of bilateral nipple pain with associated erythema.  Patient states that beginning a month ago, he describes a sharp, 3/10, nonradiating bilateral nipple pain that was worse with palpation and with associated surrounding erythema.  Since the beginning of this pain, patient states that it has been present fairly consistently without any relieving factors.  Patient was seen approximately 9 days ago by his primary care physician who was concerned that this erythema could be possible cellulitis.  Patient states that he has taken 9/10 days of his antibiotic course with no improvement of his symptoms.  Patient also states today that he had an episode of paresthesias from the elbow to the pinky and ring finger of his left hand.  Patient states that this lasted approximately 15-20 minutes and resolve spontaneously.  Patient currently denies any vision changes, tinnitus, difficulty speaking, facial droop, sore throat, chest pain, shortness of breath, abdominal pain, nausea/vomiting/diarrhea, dysuria, or weakness/numbness/paresthesias in any extremity         Past Medical History:  Diagnosis Date  . Asthma   . Heart murmur   . Tinea versicolor   . WPW (Wolff-Parkinson-White syndrome)     Patient Active Problem List   Diagnosis Date Noted  . Severe obesity (BMI >= 40) (HCC) 04/07/2018  . BMI, pediatric > 99% for age 90/04/2017  . Elevated BP without diagnosis of hypertension 11/06/2016  . Concussion without loss of consciousness  03/16/2014  . Asthma, mild intermittent 02/22/2014  . Muscular ventricular septal defect (VSD) 11/15/2013    Past Surgical History:  Procedure Laterality Date  . CIRCUMCISION      Prior to Admission medications   Medication Sig Start Date End Date Taking? Authorizing Provider  albuterol (VENTOLIN HFA) 108 (90 Base) MCG/ACT inhaler Inhale 2 puffs into the lungs every 4 (four) hours as needed for wheezing or shortness of breath (or cough). 07/22/19   Alexander Mt, MD  fluticasone (FLONASE) 50 MCG/ACT nasal spray Place 1 spray into both nostrils 2 (two) times daily. 07/22/19   Alexander Mt, MD  ibuprofen (ADVIL,MOTRIN) 200 MG tablet Take by mouth.    [provider]  ibuprofen (ADVIL,MOTRIN) 800 MG tablet Take 1 tablet (800 mg total) by mouth every 12 (twelve) hours as needed for mild pain. 04/03/17   Pritt, Jodelle Gross, MD  loratadine (CLARITIN) 10 MG tablet Take 1 tablet (10 mg total) by mouth daily. 04/03/17 07/22/19  Pritt, Jodelle Gross, MD  Magnesium Oxide 500 MG TABS Take by mouth.    [provider]  methocarbamol (ROBAXIN) 500 MG tablet Take 500 mg by mouth every 6 (six) hours as needed for muscle spasms.    [provider]    Allergies Patient has no known allergies.  Family History  Problem Relation Age of Onset  . Diabetes Maternal Grandmother     Social History Social History   Tobacco Use  . Smoking status: Never Smoker  . Smokeless tobacco: Never Used  Substance Use Topics  . Alcohol use: No  . Drug use: No  Review of Systems Constitutional: No fever/chills Eyes: No visual changes. ENT: No sore throat. Cardiovascular: Endorses chest pain. Respiratory: Denies shortness of breath. Gastrointestinal: No abdominal pain.  No nausea, no vomiting.  No diarrhea. Genitourinary: Negative for dysuria. Musculoskeletal: Negative for acute arthralgias Skin: Negative for rash. Neurological: Endorses left arm paresthesias.  Negative for  headaches, weakness/numbness in any extremity Psychiatric: Negative for suicidal ideation/homicidal ideation   ____________________________________________   PHYSICAL EXAM:  VITAL SIGNS: ED Triage Vitals [10/12/20 1746]  Enc Vitals Group     BP (!) 142/70     Pulse Rate 85     Resp 20     Temp 98.2 F (36.8 C)     Temp Source Oral     SpO2 100 %     Weight (!) 330 lb (149.7 kg)     Height 5\' 7"  (1.702 m)     Head Circumference      Peak Flow      Pain Score 0     Pain Loc      Pain Edu?      Excl. in GC?    Constitutional: Alert and oriented. Well appearing and in no acute distress. Eyes: Conjunctivae are normal. PERRL. Head: Atraumatic. Nose: No congestion/rhinnorhea. Mouth/Throat: Mucous membranes are moist. Neck: No stridor Cardiovascular: Grossly normal heart sounds.  Good peripheral circulation. Respiratory: Normal respiratory effort.  No retractions. Gastrointestinal: Soft and nontender. No distention. Musculoskeletal: No obvious deformities Neurologic:  Normal speech and language. No gross focal neurologic deficits are appreciated. Skin:  Skin is warm and dry. No rash noted.  Mild erythema surrounding bilateral nipples and tenderness to palpation over this area Psychiatric: Mood and affect are normal. Speech and behavior are normal.  ____________________________________________   LABS (all labs ordered are listed, but only abnormal results are displayed)  Labs Reviewed  CBC - Abnormal; Notable for the following components:      Result Value   MCV 79.5 (*)    All other components within normal limits  BASIC METABOLIC PANEL  TROPONIN I (HIGH SENSITIVITY)  TROPONIN I (HIGH SENSITIVITY)   ____________________________________________  EKG  ED ECG REPORT I, , the attending physician, personally viewed and interpreted this ECG.  Date: 10/12/2020 EKG Time: 1735 Rate: 82 Rhythm: normal sinus rhythm QRS Axis: normal Intervals:  normal ST/T Wave abnormalities: normal Narrative Interpretation: no evidence of acute ischemia  ____________________________________________  RADIOLOGY  ED MD interpretation: 2 view chest x-ray shows no evidence of acute abnormalities including no pneumonia, pneumothorax, or widened mediastinum  Official radiology report(s): DG Chest 2 View  Result Date: 10/12/2020 CLINICAL DATA:  Chest pain radiating to left arm EXAM: CHEST - 2 VIEW COMPARISON:  CT 06/03/2015, radiograph 06/03/2015 FINDINGS: No consolidation, features of edema, pneumothorax, or effusion. Pulmonary vascularity is normally distributed. The cardiomediastinal contours are unremarkable. No acute osseous or soft tissue abnormality. IMPRESSION: No acute cardiopulmonary abnormality. Electronically Signed   By: 08/03/2015 M.D.   On: 10/12/2020 18:12    ____________________________________________   PROCEDURES  Procedure(s) performed (including Critical Care):  .1-3 Lead EKG Interpretation Performed by: 12/10/2020, MD Authorized by: Merwyn Katos, MD     Interpretation: normal     ECG rate:  84   ECG rate assessment: normal     Rhythm: sinus rhythm     Ectopy: none     Conduction: normal       ____________________________________________   INITIAL IMPRESSION / ASSESSMENT AND PLAN / ED COURSE  As part of my medical decision making, I reviewed the following data within the electronic MEDICAL RECORD NUMBER Nursing notes reviewed and incorporated, Labs reviewed, EKG interpreted, Old chart reviewed, Radiograph reviewed and Notes from prior ED visits reviewed and incorporated        Workup: ECG, CXR, CBC, BMP, Troponin Findings: ECG: No overt evidence of STEMI. No evidence of Brugadas sign, delta wave, epsilon wave, significantly prolonged QTc, or malignant arrhythmia HS Troponin: Negative x1 Other Labs unremarkable for emergent problems. CXR: Without PTX, PNA, or widened mediastinum Last Stress Test:  Never Last Heart Catheterization: Never HEART Score: 1  Given History, Exam, and Workup I have low suspicion for ACS, Pneumothorax, Pneumonia, Pulmonary Embolus, Tamponade, Aortic Dissection or other emergent problem as a cause for this presentation.   Reassesment: Prior to discharge patients pain was controlled and they were well appearing.  Disposition:  Discharge. Strict return precautions discussed with patient with full understanding. Advised patient to follow up promptly with primary care provider       ____________________________________________   FINAL CLINICAL IMPRESSION(S) / ED DIAGNOSES  Final diagnoses:  Nipple pain  Paresthesia of left upper extremity     ED Discharge Orders    None       Note:  This document was prepared using Dragon voice recognition software and may include unintentional dictation errors.   Merwyn Katos, MD 10/12/20 2031

## 2020-10-22 ENCOUNTER — Other Ambulatory Visit: Payer: Self-pay

## 2020-10-22 ENCOUNTER — Ambulatory Visit
Admission: RE | Admit: 2020-10-22 | Discharge: 2020-10-22 | Disposition: A | Discharge status: Home / Self Care | Payer: 59 | Source: Ambulatory Visit | Attending: Internal Medicine | Admitting: Internal Medicine

## 2020-10-22 DIAGNOSIS — N644 Mastodynia: Secondary | ICD-10-CM | POA: Insufficient documentation

## 2020-10-22 DIAGNOSIS — Z6841 Body Mass Index (BMI) 40.0 and over, adult: Secondary | ICD-10-CM | POA: Insufficient documentation

## 2023-04-08 ENCOUNTER — Encounter (HOSPITAL_COMMUNITY): Payer: Self-pay | Admitting: Emergency Medicine

## 2023-04-08 ENCOUNTER — Other Ambulatory Visit: Payer: Self-pay

## 2023-04-08 ENCOUNTER — Ambulatory Visit (HOSPITAL_COMMUNITY)
Admission: EM | Admit: 2023-04-08 | Discharge: 2023-04-08 | Disposition: A | Discharge status: Home / Self Care | Payer: Self-pay | Attending: Emergency Medicine | Admitting: Emergency Medicine

## 2023-04-08 DIAGNOSIS — R21 Rash and other nonspecific skin eruption: Secondary | ICD-10-CM | POA: Insufficient documentation

## 2023-04-08 DIAGNOSIS — K529 Noninfective gastroenteritis and colitis, unspecified: Secondary | ICD-10-CM | POA: Insufficient documentation

## 2023-04-08 DIAGNOSIS — E860 Dehydration: Secondary | ICD-10-CM | POA: Insufficient documentation

## 2023-04-08 LAB — BASIC METABOLIC PANEL
Anion gap: 18 — ABNORMAL HIGH (ref 5–15)
BUN: 11 mg/dL (ref 6–20)
CO2: 19 mmol/L — ABNORMAL LOW (ref 22–32)
Calcium: 10.1 mg/dL (ref 8.9–10.3)
Chloride: 98 mmol/L (ref 98–111)
Creatinine, Ser: 1.36 mg/dL — ABNORMAL HIGH (ref 0.61–1.24)
GFR, Estimated: >60 mL/min (ref >60)
Glucose, Bld: 130 mg/dL — ABNORMAL HIGH (ref 70–99)
Potassium: 4.6 mmol/L (ref 3.5–5.1)
Sodium: 135 mmol/L (ref 135–145)

## 2023-04-08 LAB — CBC
HCT: 55 % — ABNORMAL HIGH (ref 39.0–52.0)
Hemoglobin: 18.3 g/dL — ABNORMAL HIGH (ref 13.0–17.0)
MCH: 26.5 pg (ref 26.0–34.0)
MCHC: 33.3 g/dL (ref 30.0–36.0)
MCV: 79.6 fL — ABNORMAL LOW (ref 80.0–100.0)
Platelets: 370 10*3/uL (ref 150–400)
RBC: 6.91 MIL/uL — ABNORMAL HIGH (ref 4.22–5.81)
RDW: 15.2 % (ref 11.5–15.5)
WBC: 10.3 10*3/uL (ref 4.0–10.5)
nRBC: 0 % (ref 0.0–0.2)

## 2023-04-08 MED ORDER — ONDANSETRON 8 MG PO TBDP: 8.0000 mg | ORAL_TABLET | Freq: Three times a day (TID) | ORAL | 0 refills | Status: AC | PRN

## 2023-04-08 MED ORDER — DIPHENHYDRAMINE HCL 25 MG PO TABS: 25.0000 mg | ORAL_TABLET | Freq: Four times a day (QID) | ORAL | 0 refills | Status: AC | PRN

## 2023-04-08 MED ORDER — METHYLPREDNISOLONE SODIUM SUCC 125 MG IJ SOLR
60.0000 mg | Freq: Once | INTRAMUSCULAR | Status: AC
  Administered 2023-04-08: 60 mg via INTRAVENOUS

## 2023-04-08 MED ORDER — ONDANSETRON HCL 4 MG/2ML IJ SOLN
INTRAMUSCULAR | Status: AC
  Filled 2023-04-08: qty 2

## 2023-04-08 MED ORDER — DIPHENHYDRAMINE HCL 50 MG/ML IJ SOLN
INTRAMUSCULAR | Status: AC
  Filled 2023-04-08: qty 1

## 2023-04-08 MED ORDER — DIPHENHYDRAMINE HCL 50 MG/ML IJ SOLN
25.0000 mg | Freq: Once | INTRAMUSCULAR | Status: AC
  Administered 2023-04-08: 25 mg via INTRAVENOUS

## 2023-04-08 MED ORDER — SODIUM CHLORIDE 0.9 % IV BOLUS
1000.0000 mL | Freq: Once | INTRAVENOUS | Status: AC
  Administered 2023-04-08: 1000 mL via INTRAVENOUS

## 2023-04-08 MED ORDER — ONDANSETRON HCL 4 MG/2ML IJ SOLN
4.0000 mg | Freq: Once | INTRAMUSCULAR | Status: AC
  Administered 2023-04-08: 4 mg via INTRAVENOUS

## 2023-04-08 MED ORDER — METHYLPREDNISOLONE SODIUM SUCC 125 MG IJ SOLR
INTRAMUSCULAR | Status: AC
  Filled 2023-04-08: qty 2

## 2023-04-08 NOTE — ED Triage Notes (Signed)
Saturday started having congestion and fatigued so laid down. Then got muscle aches, chills and fever. Tried tea. Monday felt little better until that night got nauseated and diarrhea. Wasn't eating much due to having watery stools and "sulfur burps".  Last night got in shower and noticed "butt was itchy". This morning got wife to check and has a rash on bottom. Now hives spread to arms and torso. Pt still has congestion and muscle aches, did vomit this morning.  Took theraflu over the weekend, Mucinex, Tums.

## 2023-04-08 NOTE — Discharge Instructions (Addendum)
You have a viral gastrointestinal infection.  Please ensure you are drinking at least 64 ounces of water daily, and consider adding liquid IV or Pedialyte to this to gain electrolytes. I have sent in some nausea medicine to use as needed and be sure to follow a bland diet to help with your diarrhea. Take 25-50 mg of Benadryl every 6 hours as needed for itching, this may cause sedation.   Seek immediate care at the closest emergency department if you develop loss of consciousness, abdominal pain, fever, shortness of breath, oral swelling or any new concerning symptoms.

## 2023-04-08 NOTE — ED Provider Notes (Signed)
[facility]    CSN: [id number] Arrival date & time: 04/08/23  0803      History   Chief Complaint Chief Complaint  Patient presents with   Diarrhea   Rash    HPI Alexander Wright is a 22 y.o. male.   Patient presents to clinic for complaints of ongoing diarrhea since Monday and full body hives that started today.  He also had 1 episode of emesis this morning, reports this was bile.  Today he has had 4 episodes of diarrhea, reports they are watery and yellow.  Denies blood in stool or vomit.  Denies any recent exposure to known illness, no other family members are sick with the same symptoms.  He did have a fever of 101 on Saturday, started to feel unwell on Sunday. Abdominal pain and cramping prior to diarrhea episodes, no current abdominal pain, nausea or emesis.   Has not really been eating or drinking anything.  Did have some ginger ale this morning.  Reports he has an appetite, but is afraid to eat due to the diarrhea.   Has tried Theraflu, Tums and Mucinex over the weekend.   Also reports full body hives since this morning.  Has not taken anything for the hives.  Denies any chest pain, wheezing or shortness of breath.  Denies any known allergies.  No changes to soaps or detergents.  The history is provided by the patient and medical records.  Diarrhea Associated symptoms: abdominal pain, fever and vomiting   Rash Associated symptoms: abdominal pain, diarrhea, fever, nausea and vomiting   Associated symptoms: no sore throat     Past Medical History:  Diagnosis Date   Asthma    Heart murmur    Tinea versicolor    WPW (Wolff-Parkinson-White syndrome)     Patient Active Problem List   Diagnosis Date Noted   Severe obesity (BMI >= 40) (HCC) 04/07/2018   BMI, pediatric > 99% for age 69/04/2017   Elevated BP without diagnosis of hypertension 11/06/2016   Concussion without loss of consciousness 03/16/2014   Asthma, mild intermittent 02/22/2014   Muscular  ventricular septal defect (VSD) 11/15/2013    Past Surgical History:  Procedure Laterality Date   CIRCUMCISION         Home Medications    Prior to Admission medications   Medication Sig Start Date End Date Taking? Authorizing Provider  diphenhydrAMINE (BENADRYL) 25 MG tablet Take 1 tablet (25 mg total) by mouth every 6 (six) hours as needed. 04/08/23  Yes Rinaldo Ratel, [place] N, FNP  ondansetron (ZOFRAN-ODT) 8 MG disintegrating tablet Take 1 tablet (8 mg total) by mouth every 8 (eight) hours as needed. 04/08/23  Yes Rinaldo Ratel, [place] N, FNP  albuterol (VENTOLIN HFA) 108 (90 Base) MCG/ACT inhaler Inhale 2 puffs into the lungs every 4 (four) hours as needed for wheezing or shortness of breath (or cough). 07/22/19   Alexander Mt, MD  ibuprofen (ADVIL,MOTRIN) 200 MG tablet Take by mouth.    [provider]  ibuprofen (ADVIL,MOTRIN) 800 MG tablet Take 1 tablet (800 mg total) by mouth every 12 (twelve) hours as needed for mild pain. 04/03/17   Pritt, Jodelle Gross, MD    Family History Family History  Problem Relation Age of Onset   Diabetes Maternal Grandmother     Social History Social History   Tobacco Use   Smoking status: Never   Smokeless tobacco: Never  Substance Use Topics   Alcohol use: No   Drug use: No  Allergies   Patient has no known allergies.   Review of Systems Review of Systems  Constitutional:  Positive for fever.  HENT:  Negative for congestion and sore throat.   Respiratory:  Negative for cough.   Cardiovascular:  Negative for chest pain.  Gastrointestinal:  Positive for abdominal pain, diarrhea, nausea and vomiting.  Skin:  Positive for rash.     Physical Exam Triage Vital Signs ED Triage Vitals  Encounter Vitals Group     BP 04/08/23 0859 (!) 142/78     Systolic BP Percentile --      Diastolic BP Percentile --      Pulse Rate 04/08/23 0859 (!) 128     Resp 04/08/23 0859 18     Temp 04/08/23 0859 98.2 F (36.8 C)     Temp Source  04/08/23 0859 Oral     SpO2 04/08/23 0859 95 %     Weight --      Height --      Head Circumference --      Peak Flow --      Pain Score 04/08/23 0857 5     Pain Loc --      Pain Education --      Exclude from Growth Chart --    No data found.  Updated Vital Signs BP (!) 140/67   Pulse (!) 111   Temp 98.2 F (36.8 C) (Oral)   Resp 18   SpO2 95%   Visual Acuity Right Eye Distance:   Left Eye Distance:   Bilateral Distance:    Right Eye Near:   Left Eye Near:    Bilateral Near:     Physical Exam Vitals and nursing note reviewed.  Constitutional:      Appearance: Normal appearance.  HENT:     Head: Normocephalic and atraumatic.     Right Ear: External ear normal.     Left Ear: External ear normal.     Nose: Nose normal.     Mouth/Throat:     Mouth: Mucous membranes are moist.  Eyes:     Conjunctiva/sclera: Conjunctivae normal.  Cardiovascular:     Rate and Rhythm: Regular rhythm. Tachycardia present.     Heart sounds: Normal heart sounds. No murmur heard. Pulmonary:     Effort: Pulmonary effort is normal. No respiratory distress.     Breath sounds: Normal breath sounds.  Abdominal:     General: Abdomen is flat. Bowel sounds are increased. There is no distension.     Palpations: Abdomen is soft. There is no mass.     Tenderness: There is no abdominal tenderness. There is no guarding or rebound.     Hernia: No hernia is present.  Musculoskeletal:        General: Normal range of motion.  Skin:    General: Skin is warm and dry.     Findings: Rash present. Rash is macular and urticarial.     Comments: Hives on bilateral arms, beck of knees, abdomen and torso. No oral involvement.   Neurological:     General: No focal deficit present.     Mental Status: He is alert and oriented to person, place, and time.  Psychiatric:        Mood and Affect: Mood normal.        Behavior: Behavior normal. Behavior is cooperative.      UC Treatments / Results  Labs (all  labs ordered are listed, but only abnormal results are displayed) Labs Reviewed  CBC  BASIC METABOLIC PANEL    EKG   Radiology No results found.  Procedures Procedures (including critical care time)  Medications Ordered in UC Medications  sodium chloride 0.9 % bolus 1,000 mL (0 mLs Intravenous Stopped 04/08/23 1014)  diphenhydrAMINE (BENADRYL) injection 25 mg (25 mg Intravenous Given 04/08/23 0936)  methylPREDNISolone sodium succinate (SOLU-MEDROL) 125 mg/2 mL injection 60 mg (60 mg Intravenous Given 04/08/23 0936)  ondansetron (ZOFRAN) injection 4 mg (4 mg Intravenous Given 04/08/23 0936)    Initial Impression / Assessment and Plan / UC Course  I have reviewed the triage vital signs and the nursing notes.  Pertinent labs & imaging results that were available during my care of the patient were reviewed by me and considered in my medical decision making (see chart for details).  Vitals and triage reviewed, patient is hemodynamically stable.  He is significantly tachycardic, suspect dehydration due to continued diarrhea.  IV fluid bolus of NS given in clinic via PIV.  CBC and BMP obtained, will contact if anything is emergent with results. Abdomen is soft and nontender with increased bowel sounds.  Staff completed EKG for tachycardia, shows sinus tachycardia, rate of 130. No STE or STD. Regular rate and rhythm.   Diffuse full body hives without airway involvement or anaphylaxis.  IV Benadryl and Solu-Medrol given.  1000 - Hives improved. Pulse down to 115. Given PO fluids.   1020 - tolerating PO fluids w/o nausea or diarrhea, pulse 110. Hives much improved.   Suspect viral gastrointestinal infection, norovirus.  Symptomatic management, emergency return precautions given, no questions at this time.      Final Clinical Impressions(s) / UC Diagnoses   Final diagnoses:  Gastroenteritis  Dehydration  Rash and nonspecific skin eruption     Discharge Instructions      You have a  viral gastrointestinal infection.  Please ensure you are drinking at least 64 ounces of water daily, and consider adding liquid IV or Pedialyte to this to gain electrolytes. I have sent in some nausea medicine to use as needed and be sure to follow a bland diet to help with your diarrhea. Take 25-50 mg of Benadryl every 6 hours as needed for itching, this may cause sedation.   Seek immediate care at the closest emergency department if you develop loss of consciousness, abdominal pain, fever, shortness of breath, oral swelling or any new concerning symptoms.       ED Prescriptions     Medication Sig Dispense Auth. Provider   ondansetron (ZOFRAN-ODT) 8 MG disintegrating tablet Take 1 tablet (8 mg total) by mouth every 8 (eight) hours as needed. 20 tablet Rinaldo Ratel, [place] N, [place]   diphenhydrAMINE (BENADRYL) 25 MG tablet Take 1 tablet (25 mg total) by mouth every 6 (six) hours as needed. 30 tablet Caulder Wehner, [place] N, [place]      PDMP not reviewed this encounter.   Franca Stakes, [place] N, [place] 04/08/23 1021
